# Patient Record
Sex: Female | Born: 1960 | Race: White | Hispanic: No | Marital: Married | Smoking: Never smoker
Health system: Southern US, Community
[De-identification: ages and names within clinical notes are randomized; demographics above are authoritative.]

## PROBLEM LIST (undated history)

## (undated) DIAGNOSIS — M1990 Unspecified osteoarthritis, unspecified site: Secondary | ICD-10-CM

## (undated) DIAGNOSIS — M419 Scoliosis, unspecified: Secondary | ICD-10-CM

## (undated) HISTORY — PX: BACK SURGERY: SHX140

---

## 1998-11-24 ENCOUNTER — Inpatient Hospital Stay (HOSPITAL_COMMUNITY): Admission: AD | Admit: 1998-11-24 | Discharge: 1998-11-24 | Payer: Self-pay | Admitting: Obstetrics

## 2011-06-16 ENCOUNTER — Emergency Department (INDEPENDENT_AMBULATORY_CARE_PROVIDER_SITE_OTHER): Payer: Self-pay

## 2011-06-16 ENCOUNTER — Emergency Department (HOSPITAL_BASED_OUTPATIENT_CLINIC_OR_DEPARTMENT_OTHER)
Admission: EM | Admit: 2011-06-16 | Discharge: 2011-06-16 | Disposition: A | Discharge status: Home / Self Care | Payer: Self-pay | Attending: Emergency Medicine | Admitting: Emergency Medicine

## 2011-06-16 ENCOUNTER — Encounter: Payer: Self-pay | Admitting: Emergency Medicine

## 2011-06-16 DIAGNOSIS — M545 Low back pain: Secondary | ICD-10-CM

## 2011-06-16 DIAGNOSIS — N200 Calculus of kidney: Secondary | ICD-10-CM | POA: Insufficient documentation

## 2011-06-16 DIAGNOSIS — D259 Leiomyoma of uterus, unspecified: Secondary | ICD-10-CM

## 2011-06-16 DIAGNOSIS — R197 Diarrhea, unspecified: Secondary | ICD-10-CM

## 2011-06-16 DIAGNOSIS — R109 Unspecified abdominal pain: Secondary | ICD-10-CM | POA: Insufficient documentation

## 2011-06-16 HISTORY — DX: Scoliosis, unspecified: M41.9

## 2011-06-16 LAB — COMPREHENSIVE METABOLIC PANEL
Alkaline Phosphatase: 54 U/L (ref 39–117)
BUN: 4 mg/dL — ABNORMAL LOW (ref 6–23)
CO2: 24 mEq/L (ref 19–32)
Chloride: 104 mEq/L (ref 96–112)
Creatinine, Ser: 0.4 mg/dL — ABNORMAL LOW (ref 0.50–1.10)
GFR calc Af Amer: >90 mL/min (ref >90)
GFR calc non Af Amer: >90 mL/min (ref >90)
Glucose, Bld: 109 mg/dL — ABNORMAL HIGH (ref 70–99)
Potassium: 3.6 mEq/L (ref 3.5–5.1)
Total Bilirubin: 0.1 mg/dL — ABNORMAL LOW (ref 0.3–1.2)

## 2011-06-16 LAB — URINALYSIS, ROUTINE W REFLEX MICROSCOPIC
Glucose, UA: NEGATIVE mg/dL
Ketones, ur: NEGATIVE mg/dL
Leukocytes, UA: NEGATIVE
Specific Gravity, Urine: 1.002 — ABNORMAL LOW (ref 1.005–1.030)
pH: 7 (ref 5.0–8.0)

## 2011-06-16 LAB — DIFFERENTIAL
Basophils Relative: 0 % (ref 0–1)
Lymphocytes Relative: 31 % (ref 12–46)
Lymphs Abs: 1 10*3/uL (ref 0.7–4.0)
Monocytes Absolute: 0.3 10*3/uL (ref 0.1–1.0)
Monocytes Relative: 9 % (ref 3–12)
Neutro Abs: 1.8 10*3/uL (ref 1.7–7.7)

## 2011-06-16 LAB — URINE MICROSCOPIC-ADD ON

## 2011-06-16 LAB — CBC
HCT: 36.6 % (ref 36.0–46.0)
Hemoglobin: 11.7 g/dL — ABNORMAL LOW (ref 12.0–15.0)
MCHC: 32 g/dL (ref 30.0–36.0)
RBC: 4.37 MIL/uL (ref 3.87–5.11)

## 2011-06-16 MED ORDER — OXYCODONE-ACETAMINOPHEN 5-325 MG PO TABS: 1.0000 | ORAL_TABLET | Freq: Four times a day (QID) | ORAL | Status: AC | PRN

## 2011-06-16 MED ORDER — MORPHINE SULFATE 4 MG/ML IJ SOLN
4.0000 mg | Freq: Once | INTRAMUSCULAR | Status: AC
  Administered 2011-06-16: 4 mg via INTRAVENOUS
  Filled 2011-06-16: qty 1

## 2011-06-16 MED ORDER — ONDANSETRON HCL 4 MG/2ML IJ SOLN
4.0000 mg | Freq: Once | INTRAMUSCULAR | Status: AC
  Administered 2011-06-16: 4 mg via INTRAVENOUS
  Filled 2011-06-16: qty 2

## 2011-06-16 MED ORDER — HYDROMORPHONE HCL PF 1 MG/ML IJ SOLN
1.0000 mg | Freq: Once | INTRAMUSCULAR | Status: AC
  Administered 2011-06-16: 1 mg via INTRAVENOUS
  Filled 2011-06-16: qty 1

## 2011-06-16 MED ORDER — SODIUM CHLORIDE 0.9 % IV BOLUS (SEPSIS)
1000.0000 mL | Freq: Once | INTRAVENOUS | Status: AC
  Administered 2011-06-16: 1000 mL via INTRAVENOUS

## 2011-06-16 MED ORDER — KETOROLAC TROMETHAMINE 30 MG/ML IJ SOLN
30.0000 mg | Freq: Once | INTRAMUSCULAR | Status: AC
  Administered 2011-06-16: 30 mg via INTRAVENOUS
  Filled 2011-06-16: qty 1

## 2011-06-16 NOTE — ED Notes (Signed)
Pt c/o right flank pain. Pt has been drinking apple juice and lemon water x 3 days to try and "clense" kidneys. Pt started having diarrhea today.

## 2011-06-16 NOTE — ED Notes (Signed)
Pt c/o flu-like symptoms with lower back pain, diarrhea, cough, pain in legs bilaterally. Diarrhea started after 3 days of drinking apple juice and lemon water.

## 2011-06-16 NOTE — ED Provider Notes (Signed)
History     CSN: [id number] Arrival date & time: 06/16/2011  2:14 AM   First MD Initiated Contact with Patient 06/16/11 [phone]      Chief Complaint  Patient presents with  . Flank Pain  . Diarrhea  . Headache    (Consider location/radiation/quality/duration/timing/severity/associated sxs/prior treatment) HPI Comments: Patient states she developed some right flank pain and right-sided pain yesterday which has been coming and going for the last 24 hours and she thought she may have a kidney stone she's she started drinking apple juice and lemon water. The only food substance that she's had for the last 24 hours of apple juice and lemon water and in the last 2-3 hours she has developed diarrhea. She denies any blood in the diarrhea and denies any abdominal pain or nausea. She states she has had one kidney stone in the past which she felt the pain was similar but the pain is currently gone. She is now only complaining of pain in her lower lumbar spine however she has a history of scoliosis and has had a metal rod placed. She denies any tingling or numbness in her legs but states that she feels very weak and dehydrated based on the diarrhea and not eating anything. Also she states for the last week she's had nasal congestion, cough, rhinorrhea. She denies any fever or shortness of breath or chest pain. She states the symptoms are starting to get better. And finally she's complaining of a headache today but states that she was unable to drink her coffee today because she was drinking apple juice and lemon water and she usually drinks coffee every day so feels that headache is most likely a result of the caffeine headache. She states the pain currently as a 9/10 in the back and the head. She states she just did not feel like she could wait until tomorrow to be seen. The pain in the back of the head did not radiate and nothing seems to make it better or worse.  Patient is a 50 y.o. female presenting with  flank pain, diarrhea, and headaches. The history is provided by the patient.  Flank Pain Associated symptoms include headaches. Pertinent negatives include no chest pain, no abdominal pain and no shortness of breath.  Diarrhea The primary symptoms include diarrhea. Primary symptoms do not include fever, abdominal pain, nausea, vomiting or dysuria.  The illness is also significant for back pain. The illness does not include chills.  Headache  Pertinent negatives include no fever, no shortness of breath, no nausea and no vomiting.    Past Medical History  Diagnosis Date  . Scoliosis     Past Surgical History  Procedure Date  . Back surgery     No family history on file.  History  Substance Use Topics  . Smoking status: Never Smoker   . Smokeless tobacco: Not on file  . Alcohol Use: Yes    OB History    Grav Para Term Preterm Abortions TAB SAB Ect Mult Living                  Review of Systems  Constitutional: Negative for fever, chills and diaphoresis.  HENT: Positive for congestion and rhinorrhea. Negative for sore throat.   Respiratory: Positive for cough. Negative for shortness of breath.   Cardiovascular: Negative for chest pain.  Gastrointestinal: Positive for diarrhea. Negative for nausea, vomiting and abdominal pain.  Genitourinary: Positive for flank pain. Negative for dysuria.  Musculoskeletal: Positive for back  pain.  Neurological: Positive for headaches.  All other systems reviewed and are negative.    Allergies  Codeine  Home Medications  No current outpatient prescriptions on file.  BP 135/79  Pulse 77  Temp(Src) 97.7 F (36.5 C) (Oral)  Resp 18  SpO2 100%  Physical Exam  Nursing note and vitals reviewed. Constitutional: She is oriented to person, place, and time. She appears well-developed and well-nourished. She appears distressed.       Tearful on exam  HENT:  Head: Normocephalic and atraumatic.  Right Ear: Tympanic membrane and ear  canal normal.  Left Ear: Tympanic membrane and ear canal normal.  Nose: Mucosal edema and rhinorrhea present.  Mouth/Throat: Oropharynx is clear and moist and mucous membranes are normal.  Eyes: EOM are normal. Pupils are equal, round, and reactive to light.  Neck: Normal range of motion. Neck supple.  Cardiovascular: Normal rate, regular rhythm, normal heart sounds and intact distal pulses.  Exam reveals no friction rub.   No murmur heard. Pulmonary/Chest: Effort normal and breath sounds normal. She has no wheezes. She has no rales.  Abdominal: Soft. Bowel sounds are normal. She exhibits no distension. There is no tenderness. There is no rebound, no guarding and no CVA tenderness.  Musculoskeletal: Normal range of motion. She exhibits no tenderness.       Lumbar back: She exhibits tenderness. She exhibits normal range of motion, no swelling and no deformity.       No edema.  Large surgical scar down the midline of the spine from lower T-spine all the way through the L-spine. Mild tenderness to palpation in the lower lumbar spine and paralumbar muscular region  Lymphadenopathy:    She has no cervical adenopathy.  Neurological: She is alert and oriented to person, place, and time. No cranial nerve deficit.  Skin: Skin is warm and dry. No rash noted.  Psychiatric: She has a normal mood and affect. Her behavior is normal.    ED Course  Procedures (including critical care time)  Labs Reviewed  URINALYSIS, ROUTINE W REFLEX MICROSCOPIC - Abnormal; Notable for the following:    Specific Gravity, Urine 1.002 (*)    Hgb urine dipstick TRACE (*)    All other components within normal limits  URINE MICROSCOPIC-ADD ON  CBC  DIFFERENTIAL  COMPREHENSIVE METABOLIC PANEL   Ct Abdomen Pelvis Wo Contrast  06/16/2011  *RADIOLOGY REPORT*  Clinical Data: Low back pain, diarrhea.  Evaluate for right-sided stone.  CT ABDOMEN AND PELVIS WITHOUT CONTRAST  Technique:  Multidetector CT imaging of the abdomen  and pelvis was performed following the standard protocol without intravenous contrast.  Comparison: None.  Findings: Asymmetric breast tissue on the right is nonspecific. Lungs are clear.  No pleural or pericardial effusion.  Intra-abdominal organ evaluation is limited without intravenous contrast.  Within this limitation, unremarkable liver, spleen, biliary system, pancreas, adrenal glands.  Symmetric renal size. No hydronephrosis or hydroureter.  Unable to trace the ureters in their entirety however no calcifications along the expected course.  No bowel obstruction no CT evidence for colitis.  Normal appendix. No free intraperitoneal air or fluid.  No lymphadenopathy.  Fibroid uterus.  Adnexa within normal limits.  Thin-walled bladder.  Leftward curvature of the thoracolumbar spine with a posterior rod in place.  No acute osseous abnormality identified.  IMPRESSION: No hydronephrosis or urinary tract calculi identified.  Fibroid uterus.  Asymmetric breast tissue on the right.  Recommend correlation with age appropriate mammogram.  Original Report Authenticated By: Greig Castilla  [facility], M.D.     No diagnosis found.    MDM   Patient presenting with multiple vague complaints which are hard to quantify. Initially she states that she was having right side and kidney pain yesterday consistent with the same type of pain she had when she passed a kidney stone. The pain was not radiating did not cause vomiting or any urinary symptoms and since she states the pain is now coming and going but is not currently they are. But now the pain has settled more in her lower back. She denies any trauma and states that she's used to back pain due to the metal rod in her back. On exam she has no CVA tenderness and she has mild lower lumbar pain which is mostly in the muscles. She has good peripheral pulses and no abdominal pain. This could be a kidney stone causing her symptoms and will check a UA, CBC, CMP to further evaluate.  Also since her above symptoms started she's been drinking a ton of apple juice and lemon water which is now caused her to have diarrhea.  Will give pain control and hydrate. Low suspicion for AAA, GI, or vaginal pathology.  Secondly patient over the last week has been complaining of upper respiratory symptoms most consistent with a viral URI. She denies any shortness of breath, chest pain, fever and is only having a mild dry cough at this time. Her respiratory exam is within normal limits and no signs of otitis, pharyngitis or pneumonia.  Lastly patient is complaining of a headache which is most consistent with caffeine withdrawal. She usually drinks coffee every morning and did not have her coffee this morning and the headache started about an hour after missing her morning coffee. There is new symptoms of the headache concerning for subarachnoid hemorrhage such as acute onset, neurologic symptoms, worst headache of life. No symptoms suggestive of a space occupying lesion or meningitis. Do not feel that this headache needs any further workup  5:14 AM On reexam patient's pain is completely resolved after second dose of pain medication. Urine did have trace hemoglobin which may be a kidney stone in the cause of her pain. The rest of her labs were within normal limits. CT did not show any large obstructing stone however they were unable to follow the ureter all the way down but if she does have a kidney stone she should be able to pass. Will discharge home this patient is tolerating by mouth's and pain is improved.      Gwyneth Sprout, MD 06/16/11 [phone]

## 2011-06-16 NOTE — ED Notes (Signed)
Dr. Plunkett at bedside.  

## 2016-06-19 ENCOUNTER — Emergency Department (HOSPITAL_BASED_OUTPATIENT_CLINIC_OR_DEPARTMENT_OTHER): Payer: No Typology Code available for payment source

## 2016-06-19 ENCOUNTER — Encounter (HOSPITAL_BASED_OUTPATIENT_CLINIC_OR_DEPARTMENT_OTHER): Payer: Self-pay | Admitting: Emergency Medicine

## 2016-06-19 ENCOUNTER — Emergency Department (HOSPITAL_BASED_OUTPATIENT_CLINIC_OR_DEPARTMENT_OTHER)
Admission: EM | Admit: 2016-06-19 | Discharge: 2016-06-19 | Disposition: A | Discharge status: Home / Self Care | Payer: No Typology Code available for payment source | Attending: Emergency Medicine | Admitting: Emergency Medicine

## 2016-06-19 DIAGNOSIS — S199XXA Unspecified injury of neck, initial encounter: Secondary | ICD-10-CM | POA: Diagnosis present

## 2016-06-19 DIAGNOSIS — S161XXA Strain of muscle, fascia and tendon at neck level, initial encounter: Secondary | ICD-10-CM | POA: Diagnosis not present

## 2016-06-19 DIAGNOSIS — Y939 Activity, unspecified: Secondary | ICD-10-CM | POA: Insufficient documentation

## 2016-06-19 DIAGNOSIS — S29012A Strain of muscle and tendon of back wall of thorax, initial encounter: Secondary | ICD-10-CM | POA: Insufficient documentation

## 2016-06-19 DIAGNOSIS — Y999 Unspecified external cause status: Secondary | ICD-10-CM | POA: Insufficient documentation

## 2016-06-19 DIAGNOSIS — Y92410 Unspecified street and highway as the place of occurrence of the external cause: Secondary | ICD-10-CM | POA: Insufficient documentation

## 2016-06-19 DIAGNOSIS — S39012A Strain of muscle, fascia and tendon of lower back, initial encounter: Secondary | ICD-10-CM

## 2016-06-19 DIAGNOSIS — S29019A Strain of muscle and tendon of unspecified wall of thorax, initial encounter: Secondary | ICD-10-CM

## 2016-06-19 HISTORY — DX: Unspecified osteoarthritis, unspecified site: M19.90

## 2016-06-19 MED ORDER — HYDROCODONE-ACETAMINOPHEN 5-325 MG PO TABS
1.0000 | ORAL_TABLET | Freq: Once | ORAL | Status: DC
  Filled 2016-06-19: qty 1

## 2016-06-19 MED ORDER — TRAMADOL HCL 50 MG PO TABS: 50.0000 mg | ORAL_TABLET | Freq: Four times a day (QID) | ORAL | 0 refills | Status: AC | PRN

## 2016-06-19 MED ORDER — CYCLOBENZAPRINE HCL 10 MG PO TABS: 10.0000 mg | ORAL_TABLET | Freq: Every day | ORAL | 0 refills | Status: AC

## 2016-06-19 MED ORDER — IBUPROFEN 800 MG PO TABS: 800.0000 mg | ORAL_TABLET | Freq: Three times a day (TID) | ORAL | 0 refills | Status: AC | PRN

## 2016-06-19 MED ORDER — IBUPROFEN 800 MG PO TABS
800.0000 mg | ORAL_TABLET | Freq: Once | ORAL | Status: AC
  Administered 2016-06-19: 800 mg via ORAL
  Filled 2016-06-19: qty 1

## 2016-06-19 NOTE — Discharge Instructions (Signed)
Your x-rays were normal.  Follow-up with your primary care Dr. for recheck Use ice and heat on your back.  You can expect to be more sore tomorrow and over the next 7-14 days

## 2016-06-19 NOTE — ED Notes (Signed)
Patient transported to X-ray 
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
## 2019-12-06 ENCOUNTER — Emergency Department (HOSPITAL_BASED_OUTPATIENT_CLINIC_OR_DEPARTMENT_OTHER): Payer: Self-pay

## 2019-12-06 ENCOUNTER — Emergency Department (HOSPITAL_BASED_OUTPATIENT_CLINIC_OR_DEPARTMENT_OTHER)
Admission: EM | Admit: 2019-12-06 | Discharge: 2019-12-06 | Disposition: A | Discharge status: Home / Self Care | Payer: Self-pay | Attending: Emergency Medicine | Admitting: Emergency Medicine

## 2019-12-06 ENCOUNTER — Encounter (HOSPITAL_BASED_OUTPATIENT_CLINIC_OR_DEPARTMENT_OTHER): Payer: Self-pay

## 2019-12-06 ENCOUNTER — Other Ambulatory Visit: Payer: Self-pay

## 2019-12-06 DIAGNOSIS — R1032 Left lower quadrant pain: Secondary | ICD-10-CM | POA: Insufficient documentation

## 2019-12-06 DIAGNOSIS — K5732 Diverticulitis of large intestine without perforation or abscess without bleeding: Secondary | ICD-10-CM | POA: Insufficient documentation

## 2019-12-06 DIAGNOSIS — K5792 Diverticulitis of intestine, part unspecified, without perforation or abscess without bleeding: Secondary | ICD-10-CM

## 2019-12-06 DIAGNOSIS — R1031 Right lower quadrant pain: Secondary | ICD-10-CM | POA: Insufficient documentation

## 2019-12-06 LAB — COMPREHENSIVE METABOLIC PANEL
ALT: 27 U/L (ref 0–44)
AST: 19 U/L (ref 15–41)
Albumin: 4 g/dL (ref 3.5–5.0)
Alkaline Phosphatase: 72 U/L (ref 38–126)
Anion gap: 10 (ref 5–15)
BUN: 12 mg/dL (ref 6–20)
CO2: 22 mmol/L (ref 22–32)
Calcium: 9 mg/dL (ref 8.9–10.3)
Chloride: 106 mmol/L (ref 98–111)
Creatinine, Ser: 0.45 mg/dL (ref 0.44–1.00)
GFR calc Af Amer: >60 mL/min (ref >60)
GFR calc non Af Amer: >60 mL/min (ref >60)
Glucose, Bld: 102 mg/dL — ABNORMAL HIGH (ref 70–99)
Potassium: 3.3 mmol/L — ABNORMAL LOW (ref 3.5–5.1)
Sodium: 138 mmol/L (ref 135–145)
Total Bilirubin: 0.8 mg/dL (ref 0.3–1.2)
Total Protein: 7 g/dL (ref 6.5–8.1)

## 2019-12-06 LAB — URINALYSIS, MICROSCOPIC (REFLEX)

## 2019-12-06 LAB — CBC
HCT: 37.4 % (ref 36.0–46.0)
Hemoglobin: 12.7 g/dL (ref 12.0–15.0)
MCH: 31.1 pg (ref 26.0–34.0)
MCHC: 34 g/dL (ref 30.0–36.0)
MCV: 91.4 fL (ref 80.0–100.0)
Platelets: 208 10*3/uL (ref 150–400)
RBC: 4.09 MIL/uL (ref 3.87–5.11)
RDW: 12.1 % (ref 11.5–15.5)
WBC: 7.5 10*3/uL (ref 4.0–10.5)
nRBC: 0 % (ref 0.0–0.2)

## 2019-12-06 LAB — URINALYSIS, ROUTINE W REFLEX MICROSCOPIC
Bilirubin Urine: NEGATIVE
Glucose, UA: NEGATIVE mg/dL
Ketones, ur: NEGATIVE mg/dL
Nitrite: NEGATIVE
Protein, ur: NEGATIVE mg/dL
Specific Gravity, Urine: 1.025 (ref 1.005–1.030)
pH: 5.5 (ref 5.0–8.0)

## 2019-12-06 LAB — LIPASE, BLOOD: Lipase: 25 U/L (ref 11–51)

## 2019-12-06 MED ORDER — KETOROLAC TROMETHAMINE 30 MG/ML IJ SOLN
30.0000 mg | Freq: Once | INTRAMUSCULAR | Status: AC
  Administered 2019-12-06: 30 mg via INTRAVENOUS
  Filled 2019-12-06: qty 1

## 2019-12-06 MED ORDER — CIPROFLOXACIN HCL 500 MG PO TABS: 500.0000 mg | ORAL_TABLET | Freq: Two times a day (BID) | ORAL | 0 refills | Status: AC

## 2019-12-06 MED ORDER — IOHEXOL 300 MG/ML  SOLN
100.0000 mL | Freq: Once | INTRAMUSCULAR | Status: AC | PRN
  Administered 2019-12-06: 100 mL via INTRAVENOUS

## 2019-12-06 MED ORDER — FENTANYL CITRATE (PF) 100 MCG/2ML IJ SOLN
50.0000 ug | Freq: Once | INTRAMUSCULAR | Status: AC
  Administered 2019-12-06: 50 ug via INTRAVENOUS
  Filled 2019-12-06: qty 2

## 2019-12-06 MED ORDER — METRONIDAZOLE 500 MG PO TABS
500.0000 mg | ORAL_TABLET | Freq: Three times a day (TID) | ORAL | 0 refills | Status: AC
Start: 2019-12-06 — End: 2019-12-16

## 2019-12-06 MED ORDER — SODIUM CHLORIDE 0.9 % IV BOLUS
1000.0000 mL | Freq: Once | INTRAVENOUS | Status: AC
  Administered 2019-12-06: 1000 mL via INTRAVENOUS

## 2019-12-06 NOTE — ED Provider Notes (Signed)
[facility] EMERGENCY DEPARTMENT Provider Note   CSN: [id number] Arrival date & time: 12/06/19  1315     History Chief Complaint  Patient presents with  . Abdominal Pain    Annette Frederick is a 59 y.o. female who presents to ED with a chief complaint of lower abdominal pain.  Reports sharp lower abdominal pain beginning yesterday.  Reports pain is worse when she tries to have a bowel movement.  She had a bowel movement today, denies any vomiting, diarrhea.  She does describe it as a pressure sensation as well.  No sick contacts or similar symptoms or suspicious food intake.  She has not tried any medications to help with her symptoms.  She denies any prior abdominal surgeries, chest pain, shortness of breath, dysuria, pelvic complaints.  HPI     Past Medical History:  Diagnosis Date  . Arthritis   . Scoliosis     There are no problems to display for this patient.   Past Surgical History:  Procedure Laterality Date  . BACK SURGERY       OB History   No obstetric history on file.     No family history on file.  Social History   Tobacco Use  . Smoking status: Never Smoker  . Smokeless tobacco: Never Used  Vaping Use  . Vaping Use: Never used  Substance Use Topics  . Alcohol use: Yes  . Drug use: No    Home Medications Prior to Admission medications   Medication Sig Start Date End Date Taking? Authorizing Provider  ibuprofen (ADVIL,MOTRIN) 800 MG tablet Take 1 tablet (800 mg total) by mouth every 8 (eight) hours as needed. 06/19/16  Yes Lawyer, Harrell Gave, PA-C  ciprofloxacin (CIPRO) 500 MG tablet Take 1 tablet (500 mg total) by mouth 2 (two) times daily for 10 days. 12/06/19 12/16/19  Tamla Winkels, PA-C  cyclobenzaprine (FLEXERIL) 10 MG tablet Take 1 tablet (10 mg total) by mouth at bedtime. 06/19/16   Lawyer, Harrell Gave, PA-C  metroNIDAZOLE (FLAGYL) 500 MG tablet Take 1 tablet (500 mg total) by mouth 3 (three) times daily for 10 days. 12/06/19 12/16/19   Alazne Quant, PA-C  traMADol (ULTRAM) 50 MG tablet Take 1 tablet (50 mg total) by mouth every 6 (six) hours as needed for severe pain. 06/19/16   Lawyer, Harrell Gave, PA-C    Allergies    Codeine  Review of Systems   Review of Systems  Constitutional: Negative for appetite change, chills and fever.  HENT: Negative for ear pain, rhinorrhea, sneezing and sore throat.   Eyes: Negative for photophobia and visual disturbance.  Respiratory: Negative for cough, chest tightness, shortness of breath and wheezing.   Cardiovascular: Negative for chest pain and palpitations.  Gastrointestinal: Positive for abdominal pain. Negative for blood in stool, constipation, diarrhea, nausea and vomiting.  Genitourinary: Negative for dysuria, hematuria and urgency.  Musculoskeletal: Negative for myalgias.  Skin: Negative for rash.  Neurological: Negative for dizziness, weakness and light-headedness.    Physical Exam Updated Vital Signs BP 133/76 Comment: rm air  Pulse 80 Comment: rm air  Temp 98.7 F (37.1 C) (Oral)   Resp 18   Ht 5\' 6"  (1.676 m)   Wt 88 kg   SpO2 94% Comment: rm air  BMI 31.33 kg/m   Physical Exam Vitals and nursing note reviewed.  Constitutional:      General: She is not in acute distress.    Appearance: She is well-developed.  HENT:     Head: Normocephalic  and atraumatic.     Nose: Nose normal.  Eyes:     General: No scleral icterus.       Left eye: No discharge.     Conjunctiva/sclera: Conjunctivae normal.  Cardiovascular:     Rate and Rhythm: Normal rate and regular rhythm.     Heart sounds: Normal heart sounds. No murmur heard.  No friction rub. No gallop.   Pulmonary:     Effort: Pulmonary effort is normal. No respiratory distress.     Breath sounds: Normal breath sounds.  Abdominal:     General: Bowel sounds are normal. There is no distension.     Palpations: Abdomen is soft.     Tenderness: There is abdominal tenderness in the right lower quadrant and left  lower quadrant. There is no guarding.  Musculoskeletal:        General: Normal range of motion.     Cervical back: Normal range of motion and neck supple.  Skin:    General: Skin is warm and dry.     Findings: No rash.  Neurological:     Mental Status: She is alert.     Motor: No abnormal muscle tone.     Coordination: Coordination normal.     ED Results / Procedures / Treatments   Labs (all labs ordered are listed, but only abnormal results are displayed) Labs Reviewed  COMPREHENSIVE METABOLIC PANEL - Abnormal; Notable for the following components:      Result Value   Potassium 3.3 (*)    Glucose, Bld 102 (*)    All other components within normal limits  URINALYSIS, ROUTINE W REFLEX MICROSCOPIC - Abnormal; Notable for the following components:   Hgb urine dipstick TRACE (*)    Leukocytes,Ua SMALL (*)    All other components within normal limits  URINALYSIS, MICROSCOPIC (REFLEX) - Abnormal; Notable for the following components:   Bacteria, UA MANY (*)    All other components within normal limits  LIPASE, BLOOD  CBC    EKG None  Radiology CT ABDOMEN PELVIS W CONTRAST  Result Date: 12/06/2019 CLINICAL DATA:  100 mL omni 300 given, tolerated well without complication or reaction Lower abdominal pain since yesterday, no urinary symptoms, pain with bowel movements, previous hx renal stones Labs within range Imar images done for harrington rods^175mL OMNIPAQUE IOHEXOL 300 MG/ML SOLNRLQ abdominal pain EXAM: CT ABDOMEN AND PELVIS WITH CONTRAST TECHNIQUE: Multidetector CT imaging of the abdomen and pelvis was performed using the standard protocol following bolus administration of intravenous contrast. CONTRAST:  147mL OMNIPAQUE IOHEXOL 300 MG/ML  SOLN COMPARISON:  CT abdomen 06/16/2011 FINDINGS: Lower chest: Lung bases are clear. Hepatobiliary: Small hypodense lesion along the medial aspect of the central RIGHT hepatic lobe (image 21/2 appears benign. Gallbladder normal. No biliary  duct dilatation. Pancreas: Pancreas is normal. No ductal dilatation. No pancreatic inflammation. Spleen: Normal spleen Adrenals/urinary tract: Adrenal glands and kidneys are normal. The ureters and bladder normal. Stomach/Bowel: Stomach, duodenum and small-bowel normal. Terminal ileum and appendix are normal. The ascending and transverse colon normal. Within the mid sigmoid colon there is a 7 cm segment of bowel wall inflammation and pericolonic stranding. There are several diverticula through this region as seen on sagittal image 72 through 58 of series 6. There is thickening of the peritoneal reflection of the deep pelvis on the LEFT (image 65/2). These findings are most consistent with acute diverticulitis of the sigmoid colon. No frank perforation. No abscess formation. Inflammation is fairly severe however. Rectum normal. Vascular/Lymphatic: Abdominal  aorta is normal caliber. No periportal or retroperitoneal adenopathy. No pelvic adenopathy. Reproductive: Lobular uterus consistent leiomyoma. No adnexal abnormality. Other: No intraperitoneal free air.  Small free fluid the pelvis. Musculoskeletal: posterior thoracolumbar fusion. Degenerative changes which are chronic IMPRESSION: 1. Acute diverticulitis of the mid sigmoid colon. No perforation or abscess; however, the inflammatory reaction is fairly severe. 2. Normal appendix. 3. No obstructive uropathy. 4. Leiomyoma of the uterus. Electronically Signed   By: Suzy Bouchard M.D.   On: 12/06/2019 15:42    Procedures Procedures (including critical care time)  Medications Ordered in ED Medications  iohexol (OMNIPAQUE) 300 MG/ML solution 100 mL (100 mLs Intravenous Contrast Given 12/06/19 1459)  sodium chloride 0.9 % bolus 1,000 mL (1,000 mLs Intravenous New Bag/Given 12/06/19 1522)  fentaNYL (SUBLIMAZE) injection 50 mcg (50 mcg Intravenous Given 12/06/19 1523)  ketorolac (TORADOL) 30 MG/ML injection 30 mg (30 mg Intravenous Given 12/06/19 1613)    ED  Course  I have reviewed the triage vital signs and the nursing notes.  Pertinent labs & imaging results that were available during my care of the patient were reviewed by me and considered in my medical decision making (see chart for details).    MDM Rules/Calculators/A&P                          59 year old female presenting to the ED with a chief complaint of lower abdominal pain beginning yesterday.  Pain is worse with having a bowel movement.  She denies diarrhea, vomiting or nausea.  On exam abdomen is tender in the left lower quadrant and right lower quadrant without rebound or guarding.  Lab work significant for unremarkable CMP, CBC and lipase.  Urinalysis with many bacteria and small leukocytes but patient asymptomatic.  CT of the abdomen pelvis shows diverticulitis without abscess or perforation.  Suspect this is the cause of her symptoms today.  Symptoms controlled here with medications.  Will be given antibiotic course as well as follow-up with her PCP.  Patient is agreeable to the plan.  She is hemodynamically stable and given strict return precautions.  All imaging, if done today, including plain films, CT scans, and ultrasounds, independently reviewed by me, and interpretations confirmed via formal radiology reads.  Patient is hemodynamically stable, in NAD, and able to ambulate in the ED. Evaluation does not show pathology that would require ongoing emergent intervention or inpatient treatment. I explained the diagnosis to the patient. Pain has been managed and has no complaints prior to discharge. Patient is comfortable with above plan and is stable for discharge at this time. All questions were answered prior to disposition. Strict return precautions for returning to the ED were discussed. Encouraged follow up with PCP.   An After Visit Summary was printed and given to the patient.   Portions of this note were generated with [employer]. Dictation errors may occur  despite best attempts at proofreading.  Final Clinical Impression(s) / ED Diagnoses Final diagnoses:  Diverticulitis    Rx / DC Orders ED Discharge Orders         Ordered    metroNIDAZOLE (FLAGYL) 500 MG tablet  3 times daily     Discontinue  Reprint     12/06/19 1627    ciprofloxacin (CIPRO) 500 MG tablet  2 times daily     Discontinue  Reprint     12/06/19 1627           Delia Heady, PA-C 12/06/19  5947    Sherwood Gambler, MD 12/06/19 1747

## 2019-12-06 NOTE — Discharge Instructions (Signed)
Take the antibiotics as prescribed. Follow-up with your primary care provider. Return to the ER for worsening pain, if you develop a fever, bloody stools, shortness of breath.

## 2019-12-06 NOTE — ED Notes (Signed)
ED Provider at bedside. 

## 2019-12-06 NOTE — ED Triage Notes (Signed)
Pt c/o lower abd pain that started yesterday. Pain is worse with walking. Pt denies N/V/D. Pt reports increased pain with BMs.

## 2021-05-09 IMAGING — CT CT ABD-PELV W/ CM
2 of 5 series · 16 of 46 positions shown, 18 images · IV contrast (omnipaque)
Comparison: CT abdomen 06/16/2011

CLINICAL DATA: 100 mL omni 300 given, tolerated well without
complication or reaction Lower abdominal pain since yesterday, no
urinary symptoms, pain with bowel movements, previous hx renal
stones Labs within range [place] images done for harrington
rods^100mL OMNIPAQUE IOHEXOL 300 MG/ML SOLNRLQ abdominal pain

EXAM:
CT ABDOMEN AND PELVIS WITH CONTRAST
TECHNIQUE: Multidetector CT imaging of the abdomen and pelvis was performed
using the standard protocol following bolus administration of
intravenous contrast.
CONTRAST:  100mL OMNIPAQUE IOHEXOL 300 MG/ML  SOLN

[Series 2: axial (person_name) (person_name) · axial · 0.98mm/px · z∈[-383,+22]mm · 13 of 91 slices shown, 15 images]
[im 5/91  soft-tissue]
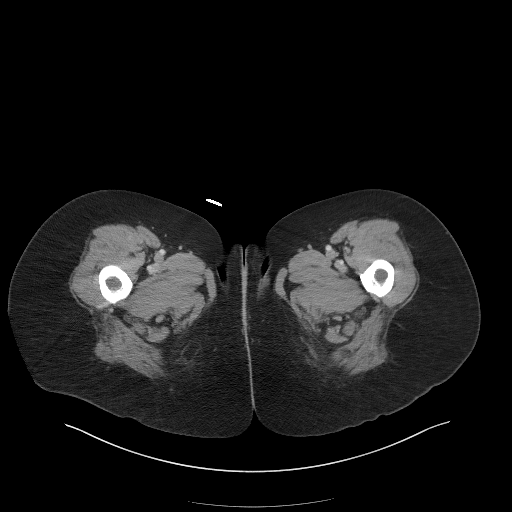
[im 5/91  bone]
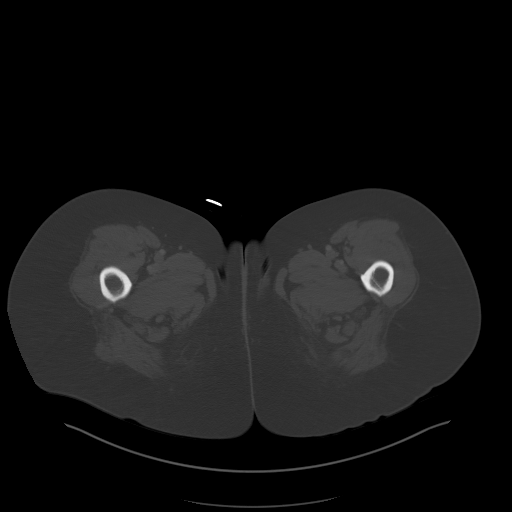
[im 15/91  soft-tissue]
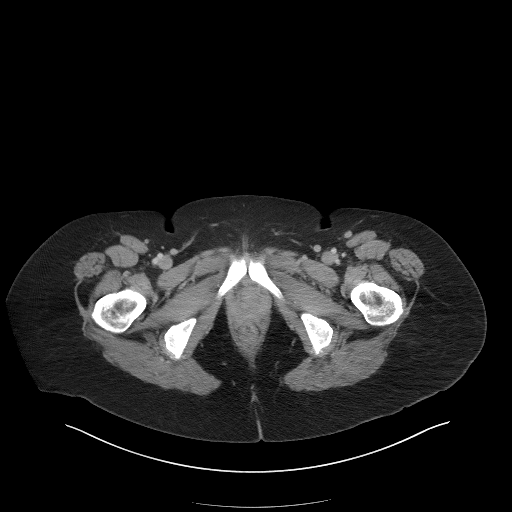
[im 19/91  soft-tissue]
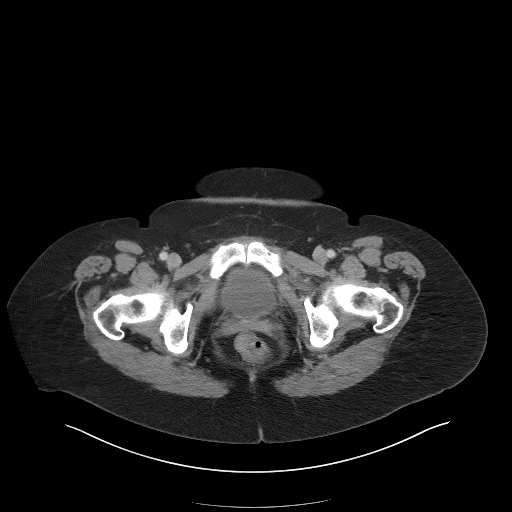
[im 24/91  soft-tissue]
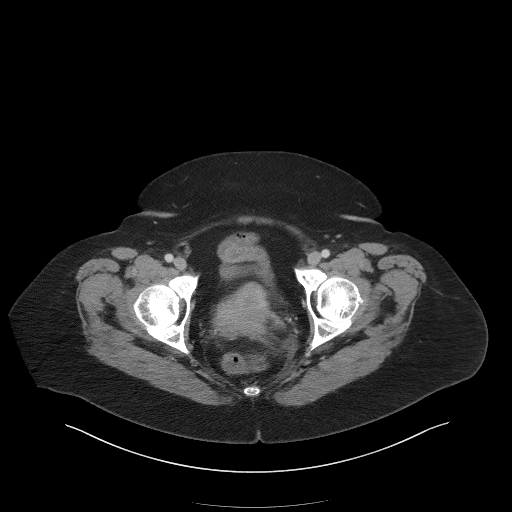
[im 34/91  soft-tissue]
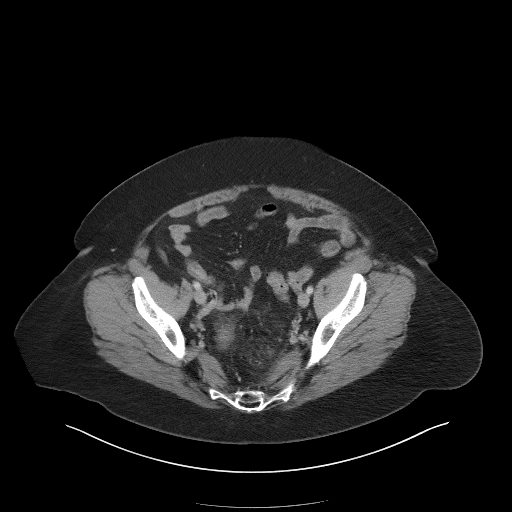
[im 38/91  soft-tissue]
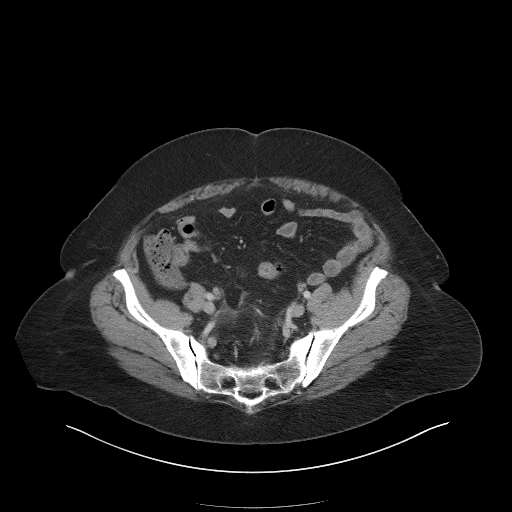
[im 48/91  soft-tissue]
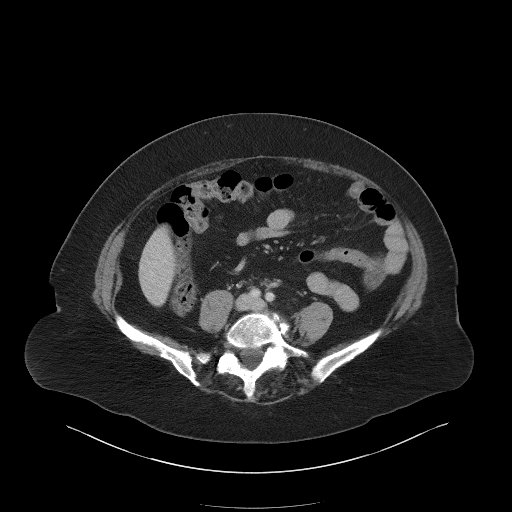
[im 53/91  soft-tissue]
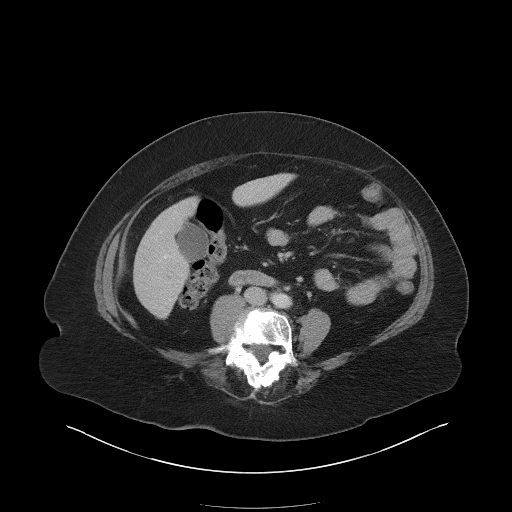
[im 57/91  soft-tissue]
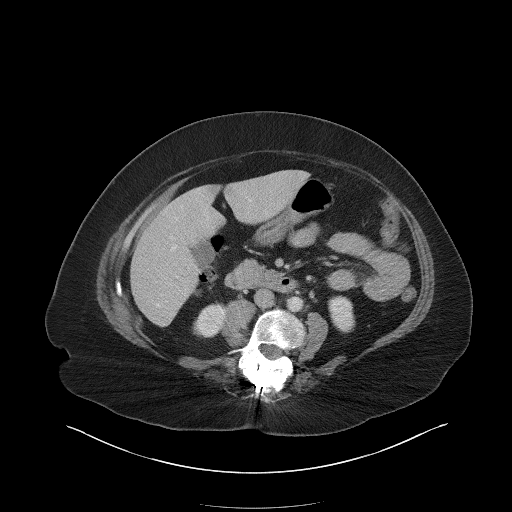
[im 57/91  bone]
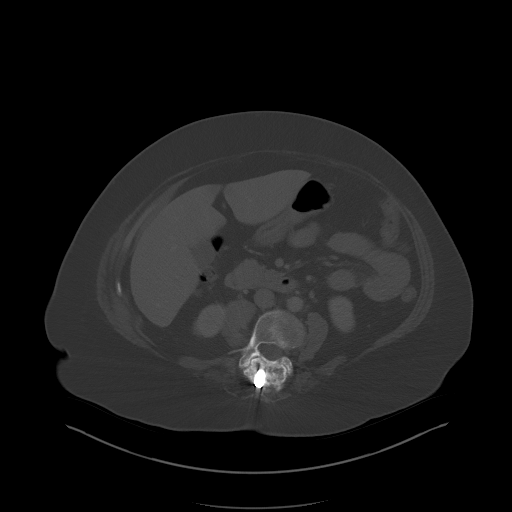
[im 67/91  soft-tissue]
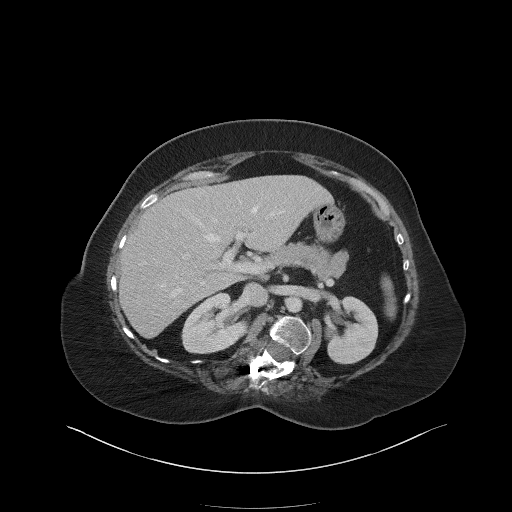
[im 72/91  soft-tissue]
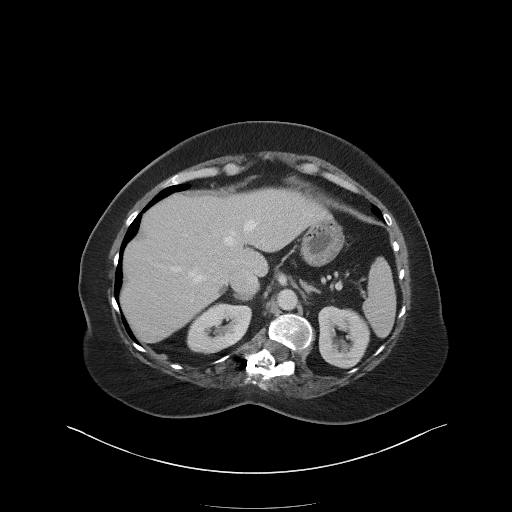
[im 76/91  soft-tissue]
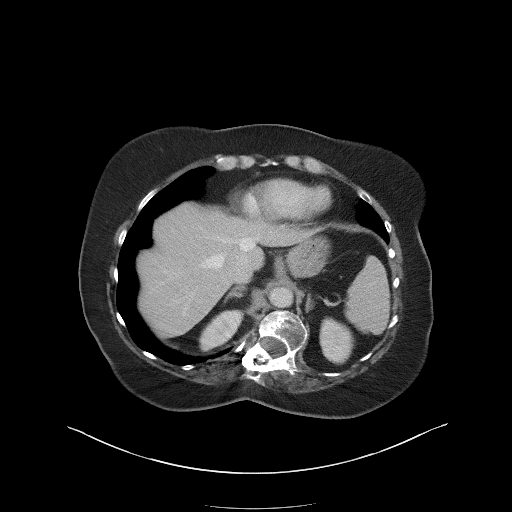
[im 86/91  soft-tissue]
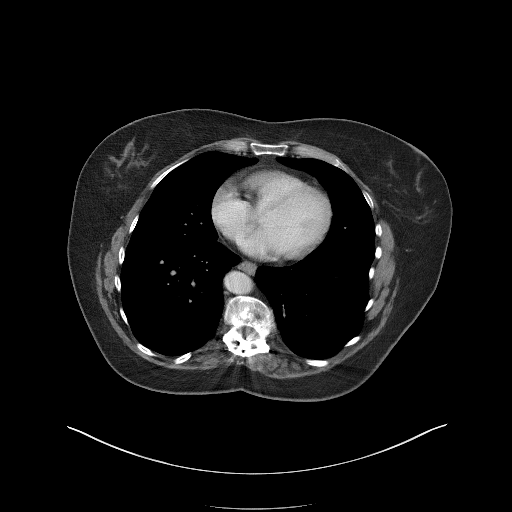

[Series 5: coronal st · coronal · 0.86mm/px · 3 of 111 slices shown]
[im 37/111  soft-tissue]
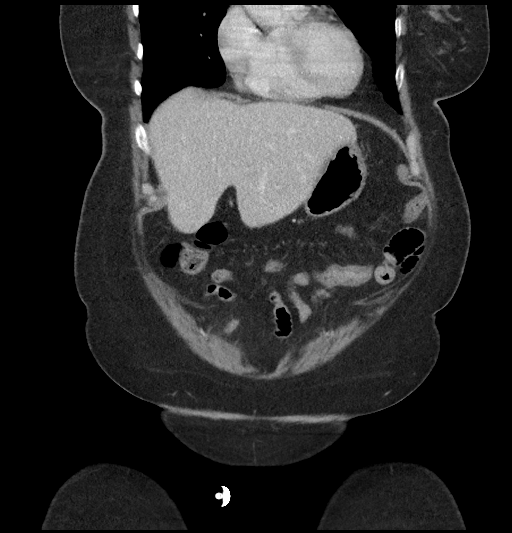
[im 49/111  soft-tissue]
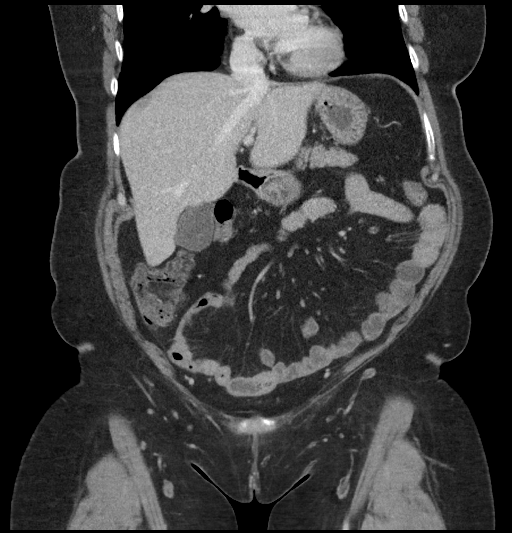
[im 62/111  soft-tissue]
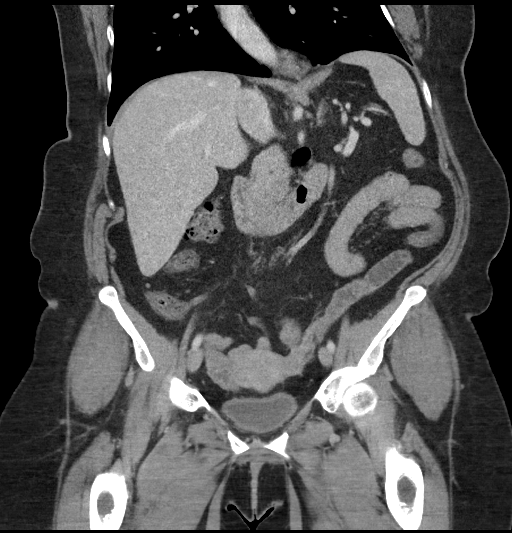

[16 of 46 positions shown; findings below may reference images not displayed]

FINDINGS: Lower chest: Lung bases are clear.

Hepatobiliary: Small hypodense lesion along the medial aspect of the
central RIGHT hepatic lobe (image [DATE] appears benign. Gallbladder
normal. No biliary duct dilatation.

Pancreas: Pancreas is normal. No ductal dilatation. No pancreatic
inflammation.

Spleen: Normal spleen

Adrenals/urinary tract: Adrenal glands and kidneys are normal. The
ureters and bladder normal.

Stomach/Bowel: Stomach, duodenum and small-bowel normal. Terminal
ileum and appendix are normal. The ascending and transverse colon
normal. Within the mid sigmoid colon there is a 7 cm segment of
bowel wall inflammation and pericolonic stranding. There are several
diverticula through this region as seen on sagittal image 72 through
58 of series 6. There is thickening of the peritoneal reflection of
the deep pelvis on the LEFT (image 65/2). These findings are most
consistent with acute diverticulitis of the sigmoid colon. No frank
perforation. No abscess formation. Inflammation is fairly severe
however. Rectum normal.

Vascular/Lymphatic: Abdominal aorta is normal caliber. No periportal
or retroperitoneal adenopathy. No pelvic adenopathy.

Reproductive: Lobular uterus consistent leiomyoma. No adnexal
abnormality.

Other: No intraperitoneal free air.  Small free fluid the pelvis.

Musculoskeletal: posterior thoracolumbar fusion. Degenerative
changes which are chronic
IMPRESSION: 1. Acute diverticulitis of the mid sigmoid colon. No perforation or
abscess; however, the inflammatory reaction is fairly severe.
2. Normal appendix.
3. No obstructive uropathy.
4. Leiomyoma of the uterus.
